# Patient Record
Sex: Male | Born: 2000 | Race: White | Hispanic: No | Marital: Single | Smoking: Never smoker
Health system: Southern US, Community
[De-identification: ages and names within clinical notes are randomized; demographics above are authoritative.]

## PROBLEM LIST (undated history)

## (undated) DIAGNOSIS — J302 Other seasonal allergic rhinitis: Secondary | ICD-10-CM

## (undated) HISTORY — DX: Other seasonal allergic rhinitis: J30.2

## (undated) HISTORY — PX: OTHER SURGICAL HISTORY: SHX169

---
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
## 2020-05-28 ENCOUNTER — Emergency Department (HOSPITAL_COMMUNITY): Payer: 59

## 2020-05-28 ENCOUNTER — Other Ambulatory Visit: Payer: Self-pay

## 2020-05-28 ENCOUNTER — Emergency Department (HOSPITAL_COMMUNITY)
Admission: EM | Admit: 2020-05-28 | Discharge: 2020-05-28 | Disposition: A | Discharge status: Home / Self Care | Payer: 59 | Attending: Emergency Medicine | Admitting: Emergency Medicine

## 2020-05-28 ENCOUNTER — Encounter (HOSPITAL_COMMUNITY): Payer: Self-pay

## 2020-05-28 DIAGNOSIS — R002 Palpitations: Secondary | ICD-10-CM | POA: Diagnosis not present

## 2020-05-28 DIAGNOSIS — R739 Hyperglycemia, unspecified: Secondary | ICD-10-CM | POA: Insufficient documentation

## 2020-05-28 DIAGNOSIS — R0602 Shortness of breath: Secondary | ICD-10-CM | POA: Diagnosis not present

## 2020-05-28 DIAGNOSIS — F17220 Nicotine dependence, chewing tobacco, uncomplicated: Secondary | ICD-10-CM | POA: Diagnosis not present

## 2020-05-28 DIAGNOSIS — R42 Dizziness and giddiness: Secondary | ICD-10-CM | POA: Insufficient documentation

## 2020-05-28 DIAGNOSIS — F419 Anxiety disorder, unspecified: Secondary | ICD-10-CM | POA: Diagnosis not present

## 2020-05-28 DIAGNOSIS — R079 Chest pain, unspecified: Secondary | ICD-10-CM

## 2020-05-28 DIAGNOSIS — R0789 Other chest pain: Secondary | ICD-10-CM | POA: Insufficient documentation

## 2020-05-28 LAB — BASIC METABOLIC PANEL
Anion gap: 8 (ref 5–15)
BUN: 12 mg/dL (ref 6–20)
CO2: 25 mmol/L (ref 22–32)
Calcium: 9.5 mg/dL (ref 8.9–10.3)
Chloride: 104 mmol/L (ref 98–111)
Creatinine, Ser: 1.17 mg/dL (ref 0.61–1.24)
GFR, Estimated: >60 mL/min (ref >60)
Glucose, Bld: 105 mg/dL — ABNORMAL HIGH (ref 70–99)
Potassium: 4.4 mmol/L (ref 3.5–5.1)
Sodium: 137 mmol/L (ref 135–145)

## 2020-05-28 LAB — CBC
HCT: 52 % (ref 39.0–52.0)
Hemoglobin: 17.6 g/dL — ABNORMAL HIGH (ref 13.0–17.0)
MCH: 28.3 pg (ref 26.0–34.0)
MCHC: 33.8 g/dL (ref 30.0–36.0)
MCV: 83.7 fL (ref 80.0–100.0)
Platelets: 325 10*3/uL (ref 150–400)
RBC: 6.21 MIL/uL — ABNORMAL HIGH (ref 4.22–5.81)
RDW: 12.3 % (ref 11.5–15.5)
WBC: 6.2 10*3/uL (ref 4.0–10.5)
nRBC: 0 % (ref 0.0–0.2)

## 2020-05-28 LAB — TROPONIN I (HIGH SENSITIVITY)
Troponin I (High Sensitivity): <2 ng/L (ref <18)
Troponin I (High Sensitivity): <2 ng/L (ref <18)

## 2020-05-28 MED ORDER — HYDROXYZINE HCL 25 MG PO TABS: 25.0000 mg | ORAL_TABLET | Freq: Four times a day (QID) | ORAL | 0 refills | Status: AC

## 2020-05-28 MED ORDER — HYDROXYZINE HCL 25 MG PO TABS
50.0000 mg | ORAL_TABLET | Freq: Once | ORAL | Status: AC
  Administered 2020-05-28: 50 mg via ORAL
  Filled 2020-05-28: qty 2

## 2020-05-28 NOTE — ED Triage Notes (Signed)
Pt sent here from [facility] for possible new onset a-fib. Pt reports anything different in his life is about 2 months ago he started using THC vape  Pens.Pt reports the sob, dizziness, cp started x2 days ago.

## 2020-05-28 NOTE — Discharge Instructions (Addendum)
As discussed all of your labs were reassuring today.  Your cardiac markers were normal.  Your EKG did not show any abnormal rhythms.  I have included the number of the cardiologist.  Please call tomorrow to schedule an appointment for further evaluation of your palpitations.  I am sending you home with some anxiety medication.  Take as needed for anxiety.  Please follow-up with PCP within the next week for further evaluation.  Return to the ER for worsening symptoms.

## 2020-05-28 NOTE — ED Provider Notes (Signed)
[facility] EMERGENCY DEPARTMENT Provider Note   CSN: [id number] Arrival date & time: 05/28/20  1407     History Chief Complaint  Patient presents with  . Chest Pain    Adrian Moore is a 20 y.o. male with no significant past medical history who presents to the ED due to intermittent shortness of breath, dizziness, palpitations, lightheadedness x3 to 4 days.  Patient describes palpitations as his heart beating faster than normal associated with substernal, non-radiating chest "discomfort".  Patient describes chest pain as a pressure-like sensation. He notes when palpitations begin he needs to "control his breathing".  Denies history of blood clots, recent surgeries, recent long immobilizations, hormonal treatments, lower extremity edema.  No family history of early CAD. No relation to exertion or change in position.  No recent illness. He vapes occasionally.  Patient was evaluated in urgent care prior to arrival and sent to the ED due to possible A. fib with RVR. No cardiac history. He does not anxiety could be contributing to some of his symptoms. No treatment prior to arrival.   History obtained from patient and past medical records. No interpreter used during encounter.    HPI: A 20 year old patient presents for evaluation of chest pain. Initial onset of pain was less than one hour ago. The patient's chest pain is described as heaviness/pressure/tightness and is not worse with exertion. The patient's chest pain is middle- or left-sided, is not well-localized, is not sharp and does not radiate to the arms/jaw/neck. The patient does not complain of nausea and denies diaphoresis. The patient has no history of stroke, has no history of peripheral artery disease, has not smoked in the past 90 days, denies any history of treated diabetes, has no relevant family history of coronary artery disease (first degree relative at less than age 11), is not hypertensive, has no history of  hypercholesterolemia and does not have an elevated BMI (>=30).   History reviewed. No pertinent past medical history.  There are no problems to display for this patient.   History reviewed. No pertinent surgical history.     History reviewed. No pertinent family history.  Social History   Tobacco Use  . Smoking status: Never Smoker  . Smokeless tobacco: Current User    Home Medications Prior to Admission medications   Medication Sig Start Date End Date Taking? Authorizing Provider  hydrOXYzine (ATARAX/VISTARIL) 25 MG tablet Take 1 tablet (25 mg total) by mouth every 6 (six) hours. 05/28/20  Yes Mannie Stabile, PA-C    Allergies    Patient has no known allergies.  Review of Systems   Review of Systems  Constitutional: Negative for chills and fever.  Respiratory: Positive for shortness of breath.   Cardiovascular: Positive for chest pain and palpitations. Negative for leg swelling.  Gastrointestinal: Negative for abdominal pain, diarrhea, nausea and vomiting.  Neurological: Positive for dizziness and light-headedness.  All other systems reviewed and are negative.   Physical Exam Updated Vital Signs BP 131/82   Pulse 70   Temp 98.3 F (36.8 C) (Oral)   Resp (!) 21   Ht 5\' 11"  (1.803 m)   Wt 77.1 kg   SpO2 96%   BMI 23.71 kg/m   Physical Exam Vitals and nursing note reviewed.  Constitutional:      General: He is not in acute distress.    Appearance: He is not ill-appearing.  HENT:     Head: Normocephalic.  Eyes:     Pupils: Pupils are  equal, round, and reactive to light.  Cardiovascular:     Rate and Rhythm: Normal rate and regular rhythm.     Pulses: Normal pulses.     Heart sounds: Normal heart sounds. No murmur heard. No friction rub. No gallop.   Pulmonary:     Effort: Pulmonary effort is normal.     Breath sounds: Normal breath sounds.  Abdominal:     General: Abdomen is flat. There is no distension.     Palpations: Abdomen is soft.      Tenderness: There is no abdominal tenderness. There is no guarding or rebound.  Musculoskeletal:        General: Normal range of motion.     Cervical back: Neck supple.     Comments: No lower extremity edema. Negative homan sign bilaterally.  Skin:    General: Skin is warm and dry.  Neurological:     General: No focal deficit present.     Mental Status: He is alert.  Psychiatric:        Mood and Affect: Mood normal.        Behavior: Behavior normal.     ED Results / Procedures / Treatments   Labs (all labs ordered are listed, but only abnormal results are displayed) Labs Reviewed  BASIC METABOLIC PANEL - Abnormal; Notable for the following components:      Result Value   Glucose, Bld 105 (*)    All other components within normal limits  CBC - Abnormal; Notable for the following components:   RBC 6.21 (*)    Hemoglobin 17.6 (*)    All other components within normal limits  TROPONIN I (HIGH SENSITIVITY)  TROPONIN I (HIGH SENSITIVITY)    EKG EKG Interpretation  Date/Time:  Tuesday May 28 2020 14:19:44 EDT Ventricular Rate:  88 PR Interval:  116 QRS Duration: 126 QT Interval:  366 QTC Calculation: 442 R Axis:   121 Text Interpretation: Normal sinus rhythm with sinus arrhythmia Right bundle branch block Abnormal ECG No old tracing to compare Confirmed by Susy Frizzle [phone]) on 05/28/2020 2:58:08 PM   Radiology DG Chest 2 View  Result Date: 05/28/2020 CLINICAL DATA:  Shortness of breath with chest tightness EXAM: CHEST - 2 VIEW COMPARISON:  None. FINDINGS: Lungs are clear. Heart size and pulmonary vascularity are normal. No adenopathy. No pneumothorax. No bone lesions. IMPRESSION: Lungs clear.  Cardiac silhouette normal. Electronically Signed   By: Bretta Bang III M.D.   On: 05/28/2020 16:26    Procedures Procedures   Medications Ordered in ED Medications  hydrOXYzine (ATARAX/VISTARIL) tablet 50 mg (has no administration in time range)    ED Course  I  have reviewed the triage vital signs and the nursing notes.  Pertinent labs & imaging results that were available during my care of the patient were reviewed by me and considered in my medical decision making (see chart for details).    MDM Rules/Calculators/A&P HEAR Score: 2                       20 year old male presents to the ED from urgent care due to palpitations associated with shortness of breath, dizziness, lightheadedness, and chest pressure.  No cardiac history.  Patient's sent from urgent care due to possible A. fib with RVR however, I have reviewed the EKG from urgent care which demonstrates normal sinus rhythm with no signs of acute ischemia. No A. Fib.  Upon arrival, patient afebrile, not tachycardic or hypoxic.  Patient in no acute distress and nontoxic-appearing.  Physical exam reassuring.  No signs of DVT.  Low suspicion for PE/DVT.  Cardiac lab order to rule out cardiac etiology. Suspect a possible anxiety etiology.   CBC reassuring with no leukocytosis. BMP with mild hyperglycemia at 105. No anion gap. Normal renal function. No major electrolyte derangements.  Chest x-ray personally reviewed which is negative for signs of pneumonia, pneumothorax, or widened mediastinum. EKG reviewed which demonstrates NSR with sinus arrhythmia and RBBB. No acute ischemia. Delta troponin flat. Low suspicion for ACS. Patient has had normal HR during his entire ED stay. PERC negative and low risk using wells criteria. Doubt PE/DVT. Patient notes improvement in symptoms after Vistaril. Cardiology number given to patient at discharge. Advised patient to call tomorrow to schedule an appointment for further evaluation of palpitations. Patient discharged with vistaril as needed for anxiety. Strict ED precautions discussed with patient. Patient states understanding and agrees to plan. Patient discharged home in no acute distress and stable vitals.  Final Clinical Impression(s) / ED Diagnoses Final diagnoses:   Nonspecific chest pain  Anxiety  Palpitations    Rx / DC Orders ED Discharge Orders         Ordered    hydrOXYzine (ATARAX/VISTARIL) 25 MG tablet  Every 6 hours        05/28/20 1842           Jesusita Oka 05/28/20 1907    Pollyann Savoy, MD 05/29/20 [phone]

## 2020-06-19 ENCOUNTER — Other Ambulatory Visit: Payer: Self-pay

## 2020-06-19 ENCOUNTER — Ambulatory Visit (INDEPENDENT_AMBULATORY_CARE_PROVIDER_SITE_OTHER): Payer: 59

## 2020-06-19 ENCOUNTER — Encounter: Payer: Self-pay | Admitting: Cardiology

## 2020-06-19 ENCOUNTER — Ambulatory Visit (INDEPENDENT_AMBULATORY_CARE_PROVIDER_SITE_OTHER): Payer: 59 | Admitting: Cardiology

## 2020-06-19 VITALS — BP 126/80 | HR 94 | Ht 72.0 in | Wt 168.0 lb

## 2020-06-19 DIAGNOSIS — R002 Palpitations: Secondary | ICD-10-CM | POA: Insufficient documentation

## 2020-06-19 DIAGNOSIS — Z7289 Other problems related to lifestyle: Secondary | ICD-10-CM

## 2020-06-19 DIAGNOSIS — I4510 Unspecified right bundle-branch block: Secondary | ICD-10-CM

## 2020-06-19 HISTORY — DX: Unspecified right bundle-branch block: I45.10

## 2020-06-19 HISTORY — DX: Other problems related to lifestyle: Z72.89

## 2020-06-19 HISTORY — DX: Palpitations: R00.2

## 2020-06-19 NOTE — Progress Notes (Signed)
Cardiology Consultation:    Date:  06/19/2020   ID:  COAL NEARHOOD, DOB [birth year], MRN [id number]  PCP:  Montez Hageman, DO  Cardiologist:  Gypsy Balsam, MD   Referring MD: No ref. provider found   Chief Complaint  Patient presents with  . Heart fluttering    History of Present Illness:    Adrian Moore is a 20 y.o. male who is being seen today for the evaluation of fluttering in the chest at the request of No ref. provider found.  He isa healthy 20 years old gentleman who likes to exercise recently however he did notice some fluttering when he mean by that he will feel his heart speeding up last for few minutes there is no chest pain tightness squeezing pressure burning associated with this sensation there is no shortness of breath but sometimes he will feel dizzy he described this as kind of unsteady but not to the point of passing out.  Almost things make him work.  He exercised on the regular basis but he use always before exercising some energy boost.  He denies having any chest pain tightness squeezing pressure burning chest.  He thinks that his ability to exercise is a little less than before because of those fluttering in the chest palpitations.  He does not do aerobic exercise he does do weightlifting. Does not have family history of premature coronary disease. He is not on any special diet. He vapes but does not smoke cigarettes  Past Medical History:  Diagnosis Date  . Seasonal allergies     Past Surgical History:  Procedure Laterality Date  . ACL repaired      Current Medications: Current Meds  Medication Sig  . hydrOXYzine (ATARAX/VISTARIL) 25 MG tablet Take 1 tablet (25 mg total) by mouth every 6 (six) hours. (Patient taking differently: Take 25 mg by mouth as needed (Palpitations).)     Allergies:   Patient has no known allergies.   Social History   Socioeconomic History  . Marital status: Single    Spouse name: Not on file  . Number of children:  Not on file  . Years of education: Not on file  . Highest education level: Not on file  Occupational History  . Not on file  Tobacco Use  . Smoking status: Never Smoker  . Smokeless tobacco: Current User  Substance and Sexual Activity  . Alcohol use: Yes    Alcohol/week: 2.0 standard drinks    Types: 1 Cans of beer, 1 Shots of liquor per week    Comment: Ocassional   . Drug use: Never  . Sexual activity: Not Currently  Other Topics Concern  . Not on file  Social History Narrative  . Not on file   Social Determinants of Health   Financial Resource Strain: Not on file  Food Insecurity: Not on file  Transportation Needs: Not on file  Physical Activity: Not on file  Stress: Not on file  Social Connections: Not on file     Family History: The patient's family history includes Healthy in his father and mother; Heart disease in his maternal grandfather, maternal grandmother, and paternal grandmother. ROS:   Please see the history of present illness.    All 14 point review of systems negative except as described per history of present illness.  EKGs/Labs/Other Studies Reviewed:    The following studies were reviewed today: I did review record from the emergency room there is no TSH checked Chem-7 was normal  CBC showed a little bit elevation of red blood cells.  EKG:  EKG is  ordered today.  The ekg ordered today demonstrates normal sinus rhythm normal P interval right bundle branch block  Recent Labs: 05/28/2020: BUN 12; Creatinine, Ser 1.17; Hemoglobin 17.6; Platelets 325; Potassium 4.4; Sodium 137  Recent Lipid Panel No results found for: CHOL, TRIG, HDL, CHOLHDL, VLDL, LDLCALC, LDLDIRECT  Physical Exam:    VS:  BP 126/80 (BP Location: Right Arm, Patient Position: Sitting)   Pulse 94   Ht 6' (1.829 m)   Wt 168 lb (76.2 kg)   SpO2 95%   BMI 22.78 kg/m     Wt Readings from Last 3 Encounters:  06/19/20 168 lb (76.2 kg)  05/28/20 170 lb (77.1 kg)     GEN:  Well  nourished, well developed in no acute distress HEENT: Normal NECK: No JVD; No carotid bruits LYMPHATICS: No lymphadenopathy CARDIAC: RRR, no murmurs, no rubs, no gallops RESPIRATORY:  Clear to auscultation without rales, wheezing or rhonchi  ABDOMEN: Soft, non-tender, non-distended MUSCULOSKELETAL:  No edema; No deformity  SKIN: Warm and dry NEUROLOGIC:  Alert and oriented x 3 PSYCHIATRIC:  Normal affect   ASSESSMENT:    1. Palpitations   2. Right bundle branch block   3. Engages in vaping    PLAN:    In order of problems listed above:  1. Palpitations.  I will ask him to wear Zio patch to see exactly what kind of arrhythmia with dealing with.  As a part of evaluation we will schedule him to have an echocardiogram he does use caffeine product before exercising I told him not to do that.  He is also vaping and I told him to stop doing this as well.  His mother was present during our conversation. 2. Right bundle branch block which is incidental finding unclear significance at this moment.  Again echocardiogram will be done.  Will make me worry is the fact he does have a little elevation of red blood cells.  Again echocardiogram will be done with looking for any pathology suppression of the right side also for potential shunts.  However he is S2 is still single.  And normally stated. 3. Vaping again he was told to quit doing this. 4. We will check his cholesterol next time when he will be in our office.   Medication Adjustments/Labs and Tests Ordered: Current medicines are reviewed at length with the patient today.  Concerns regarding medicines are outlined above.  Orders Placed This Encounter  Procedures  . TSH  . Lipid panel  . LONG TERM MONITOR (3-14 DAYS)  . EKG 12-Lead  . ECHOCARDIOGRAM COMPLETE   No orders of the defined types were placed in this encounter.   Signed, Georgeanna Lea, MD, [facility]. 06/19/2020 4:27 PM    [facility]

## 2020-06-19 NOTE — Patient Instructions (Signed)
Medication Instructions:  Your physician recommends that you continue on your current medications as directed. Please refer to the Current Medication list given to you today.  *If you need a refill on your cardiac medications before your next appointment, please call your pharmacy*   Lab Work: Your physician recommends that you return for lab work when fasting: lipid, tsh  If you have labs (blood work) drawn today and your tests are completely normal, you will receive your results only by: Marland Kitchen MyChart Message (if you have MyChart) OR . A paper copy in the mail If you have any lab test that is abnormal or we need to change your treatment, we will call you to review the results.   Testing/Procedures: Your physician has requested that you have an echocardiogram. Echocardiography is a painless test that uses sound waves to create images of your heart. It provides your doctor with information about the size and shape of your heart and how well your heart's chambers and valves are working. This procedure takes approximately one hour. There are no restrictions for this procedure.  A zio monitor was ordered today. It will remain on for 7 days. You will then return monitor and event diary in provided box. It takes 1-2 weeks for report to be downloaded and returned to [place]. We will call you with the results. If monitor falls off or has orange flashing light, please call Zio for further instructions.      Follow-Up: At [facility], you and your health needs are our priority.  As part of our continuing mission to provide you with exceptional heart care, we have created designated Provider Care Teams.  These Care Teams include your primary Cardiologist (physician) and Advanced Practice Providers (APPs -  Physician Assistants and Nurse Practitioners) who all work together to provide you with the care you need, when you need it.  We recommend signing up for the patient portal called "MyChart".  Sign up  information is provided on this After Visit Summary.  MyChart is used to connect with patients for Virtual Visits (Telemedicine).  Patients are able to view lab/test results, encounter notes, upcoming appointments, etc.  Non-urgent messages can be sent to your provider as well.   To learn more about what you can do with MyChart, go to [email or web address].    Your next appointment:   2 month(s)  The format for your next appointment:   In Person  Provider:   Gypsy Balsam, MD   Other Instructions   Echocardiogram An echocardiogram is a test that uses sound waves (ultrasound) to produce images of the heart. Images from an echocardiogram can provide important information about:  Heart size and shape.  The size and thickness and movement of your heart's walls.  Heart muscle function and strength.  Heart valve function or if you have stenosis. Stenosis is when the heart valves are too narrow.  If blood is flowing backward through the heart valves (regurgitation).  A tumor or infectious growth around the heart valves.  Areas of heart muscle that are not working well because of poor blood flow or injury from a heart attack.  Aneurysm detection. An aneurysm is a weak or damaged part of an artery wall. The wall bulges out from the normal force of blood pumping through the body. Tell a health care provider about:  Any allergies you have.  All medicines you are taking, including vitamins, herbs, eye drops, creams, and over-the-counter medicines.  Any blood disorders you have.  Any  surgeries you have had.  Any medical conditions you have.  Whether you are pregnant or may be pregnant. What are the risks? Generally, this is a safe test. However, problems may occur, including an allergic reaction to dye (contrast) that may be used during the test. What happens before the test? No specific preparation is needed. You may eat and drink normally. What happens during the  test?  You will take off your clothes from the waist up and put on a hospital gown.  Electrodes or electrocardiogram (ECG)patches may be placed on your chest. The electrodes or patches are then connected to a device that monitors your heart rate and rhythm.  You will lie down on a table for an ultrasound exam. A gel will be applied to your chest to help sound waves pass through your skin.  A handheld device, called a transducer, will be pressed against your chest and moved over your heart. The transducer produces sound waves that travel to your heart and bounce back (or "echo" back) to the transducer. These sound waves will be captured in real-time and changed into images of your heart that can be viewed on a video monitor. The images will be recorded on a computer and reviewed by your health care provider.  You may be asked to change positions or hold your breath for a short time. This makes it easier to get different views or better views of your heart.  In some cases, you may receive contrast through an IV in one of your veins. This can improve the quality of the pictures from your heart. The procedure may vary among health care providers and hospitals.   What can I expect after the test? You may return to your normal, everyday life, including diet, activities, and medicines, unless your health care provider tells you not to do that. Follow these instructions at home:  It is up to you to get the results of your test. Ask your health care provider, or the department that is doing the test, when your results will be ready.  Keep all follow-up visits. This is important. Summary  An echocardiogram is a test that uses sound waves (ultrasound) to produce images of the heart.  Images from an echocardiogram can provide important information about the size and shape of your heart, heart muscle function, heart valve function, and other possible heart problems.  You do not need to do anything to  prepare before this test. You may eat and drink normally.  After the echocardiogram is completed, you may return to your normal, everyday life, unless your health care provider tells you not to do that. This information is not intended to replace advice given to you by your health care provider. Make sure you discuss any questions you have with your health care provider. Document Revised: 09/26/2019 Document Reviewed: 09/26/2019 Elsevier Patient Education  2021 [employer].

## 2020-07-05 ENCOUNTER — Telehealth: Payer: Self-pay | Admitting: Cardiology

## 2020-07-05 NOTE — Telephone Encounter (Signed)
New message ° ° ° ° ° °Returning a call to the nurse to get monitor results °

## 2020-07-05 NOTE — Telephone Encounter (Signed)
Left message for patient to return call.

## 2020-07-08 NOTE — Telephone Encounter (Signed)
Left message for patient to return call.

## 2020-07-10 NOTE — Telephone Encounter (Signed)
Patient informed of results.  

## 2020-07-16 ENCOUNTER — Other Ambulatory Visit: Payer: 59

## 2020-08-07 ENCOUNTER — Other Ambulatory Visit: Payer: Self-pay

## 2020-08-07 ENCOUNTER — Ambulatory Visit (INDEPENDENT_AMBULATORY_CARE_PROVIDER_SITE_OTHER): Payer: 59

## 2020-08-07 DIAGNOSIS — I4510 Unspecified right bundle-branch block: Secondary | ICD-10-CM | POA: Diagnosis not present

## 2020-08-07 DIAGNOSIS — R002 Palpitations: Secondary | ICD-10-CM | POA: Diagnosis not present

## 2020-08-07 LAB — ECHOCARDIOGRAM COMPLETE
Area-P 1/2: 4.41 cm2
S' Lateral: 3 cm

## 2020-08-07 NOTE — Progress Notes (Signed)
Complete echocardiogram performed.  Jimmy Saskia Simerson RDCS, RVT  

## 2020-08-12 ENCOUNTER — Telehealth: Payer: Self-pay | Admitting: Cardiology

## 2020-08-12 NOTE — Telephone Encounter (Signed)
Patient returning call for echo results. 

## 2020-08-12 NOTE — Telephone Encounter (Signed)
Called patient informed him of results.  

## 2020-09-03 ENCOUNTER — Other Ambulatory Visit: Payer: Self-pay

## 2020-09-03 DIAGNOSIS — J302 Other seasonal allergic rhinitis: Secondary | ICD-10-CM | POA: Insufficient documentation

## 2020-09-04 ENCOUNTER — Encounter: Payer: Self-pay | Admitting: Cardiology

## 2020-09-04 ENCOUNTER — Ambulatory Visit (INDEPENDENT_AMBULATORY_CARE_PROVIDER_SITE_OTHER): Payer: 59 | Admitting: Cardiology

## 2020-09-04 ENCOUNTER — Other Ambulatory Visit: Payer: Self-pay

## 2020-09-04 VITALS — BP 112/78 | HR 83 | Ht 72.0 in | Wt 179.1 lb

## 2020-09-04 DIAGNOSIS — R002 Palpitations: Secondary | ICD-10-CM | POA: Diagnosis not present

## 2020-09-04 DIAGNOSIS — I4510 Unspecified right bundle-branch block: Secondary | ICD-10-CM

## 2020-09-04 DIAGNOSIS — J302 Other seasonal allergic rhinitis: Secondary | ICD-10-CM | POA: Diagnosis not present

## 2020-09-04 NOTE — Progress Notes (Signed)
Cardiology Office Note:    Date:  09/04/2020   ID:  Adrian Moore, DOB 08-13-2000, MRN [id number]  PCP:  Montez Hageman, DO  Cardiologist:  Gypsy Balsam, MD    Referring MD: Montez Hageman, DO   Chief Complaint  Patient presents with   Follow-up  I am doing well  History of Present Illness:    Adrian Moore is a 20 y.o. male who was referred to [place] because of some palpitations.  Monitor was put on and we find no significant arrhythmia.  He also got some history of anxiety.  He comes today to follow-up overall he is doing well.  Denies have any chest pain tightness squeezing pressure burning chest.  We did talk about need to exercise on the regular basis and good diet.  Past Medical History:  Diagnosis Date   Engages in vaping 06/19/2020   Palpitations 06/19/2020   Right bundle branch block 06/19/2020   Seasonal allergies     Past Surgical History:  Procedure Laterality Date   ACL repaired      Current Medications: Current Meds  Medication Sig   hydrOXYzine (ATARAX/VISTARIL) 25 MG tablet Take 1 tablet (25 mg total) by mouth every 6 (six) hours. (Patient taking differently: Take 25 mg by mouth as needed for anxiety (Palpitations).)     Allergies:   Patient has no known allergies.   Social History   Socioeconomic History   Marital status: Single    Spouse name: Not on file   Number of children: Not on file   Years of education: Not on file   Highest education level: Not on file  Occupational History   Not on file  Tobacco Use   Smoking status: Never   Smokeless tobacco: Current  Substance and Sexual Activity   Alcohol use: Yes    Alcohol/week: 2.0 standard drinks    Types: 1 Cans of beer, 1 Shots of liquor per week    Comment: Ocassional    Drug use: Never   Sexual activity: Not Currently  Other Topics Concern   Not on file  Social History Narrative   Not on file   Social Determinants of Health   Financial Resource Strain: Not on file  Food  Insecurity: Not on file  Transportation Needs: Not on file  Physical Activity: Not on file  Stress: Not on file  Social Connections: Not on file     Family History: The patient's family history includes Healthy in his father and mother; Heart disease in his maternal grandfather, maternal grandmother, and paternal grandmother. ROS:   Please see the history of present illness.    All 14 point review of systems negative except as described per history of present illness  EKGs/Labs/Other Studies Reviewed:      Recent Labs: 05/28/2020: BUN 12; Creatinine, Ser 1.17; Hemoglobin 17.6; Platelets 325; Potassium 4.4; Sodium 137  Recent Lipid Panel No results found for: CHOL, TRIG, HDL, CHOLHDL, VLDL, LDLCALC, LDLDIRECT  Physical Exam:    VS:  BP 112/78 (BP Location: Right Arm, Patient Position: Sitting)   Pulse 83   Ht 6' (1.829 m)   Wt 179 lb 1.3 oz (81.2 kg)   SpO2 98%   BMI 24.29 kg/m     Wt Readings from Last 3 Encounters:  09/04/20 179 lb 1.3 oz (81.2 kg)  06/19/20 168 lb (76.2 kg)  05/28/20 170 lb (77.1 kg)     GEN:  Well nourished, well developed in no acute distress HEENT:  Normal NECK: No JVD; No carotid bruits LYMPHATICS: No lymphadenopathy CARDIAC: RRR, no murmurs, no rubs, no gallops RESPIRATORY:  Clear to auscultation without rales, wheezing or rhonchi  ABDOMEN: Soft, non-tender, non-distended MUSCULOSKELETAL:  No edema; No deformity  SKIN: Warm and dry LOWER EXTREMITIES: no swelling NEUROLOGIC:  Alert and oriented x 3 PSYCHIATRIC:  Normal affect   ASSESSMENT:    1. Palpitations   2. Right bundle branch block   3. Seasonal allergies    PLAN:    In order of problems listed above:  Palpitations seems to be under control monitor did not show any significant arrhythmia, echocardiogram showed normal structural heart. Right bundle branch block.  Noted, echocardiogram normal Seasonal allergies have been treated by internal medicine team  Overall he is doing  fine from cardiac point of view I see him back 1 more time in a year and then on as-needed basis.  I asked him to let me know if he develop palpitations or syncope   Medication Adjustments/Labs and Tests Ordered: Current medicines are reviewed at length with the patient today.  Concerns regarding medicines are outlined above.  No orders of the defined types were placed in this encounter.  Medication changes: No orders of the defined types were placed in this encounter.   Signed, Georgeanna Lea, MD, [facility] 09/04/2020 12:49 PM    [facility]

## 2020-09-04 NOTE — Patient Instructions (Signed)
Medication Instructions:  .isntcu  *If you need a refill on your cardiac medications before your next appointment, please call your pharmacy*   Lab Work: Your physician recommends that you return for lab work in:  TODAY: Lipids, TSH  If you have labs (blood work) drawn today and your tests are completely normal, you will receive your results only by: MyChart Message (if you have MyChart) OR A paper copy in the mail If you have any lab test that is abnormal or we need to change your treatment, we will call you to review the results.   Testing/Procedures: None   Follow-Up: At [facility], you and your health needs are our priority.  As part of our continuing mission to provide you with exceptional heart care, we have created designated Provider Care Teams.  These Care Teams include your primary Cardiologist (physician) and Advanced Practice Providers (APPs -  Physician Assistants and Nurse Practitioners) who all work together to provide you with the care you need, when you need it.  We recommend signing up for the patient portal called "MyChart".  Sign up information is provided on this After Visit Summary.  MyChart is used to connect with patients for Virtual Visits (Telemedicine).  Patients are able to view lab/test results, encounter notes, upcoming appointments, etc.  Non-urgent messages can be sent to your provider as well.   To learn more about what you can do with MyChart, go to [email or web address].    Your next appointment:   1 year(s)  The format for your next appointment:   In Person  Provider:   Gypsy Balsam, MD   Other Instructions

## 2023-03-02 IMAGING — CR DG CHEST 2V
2 series · 2 of 2 positions shown · non-contrast
Comparison: None.

CLINICAL DATA: Shortness of breath with chest tightness

EXAM:
CHEST - 2 VIEW

[chest lat]
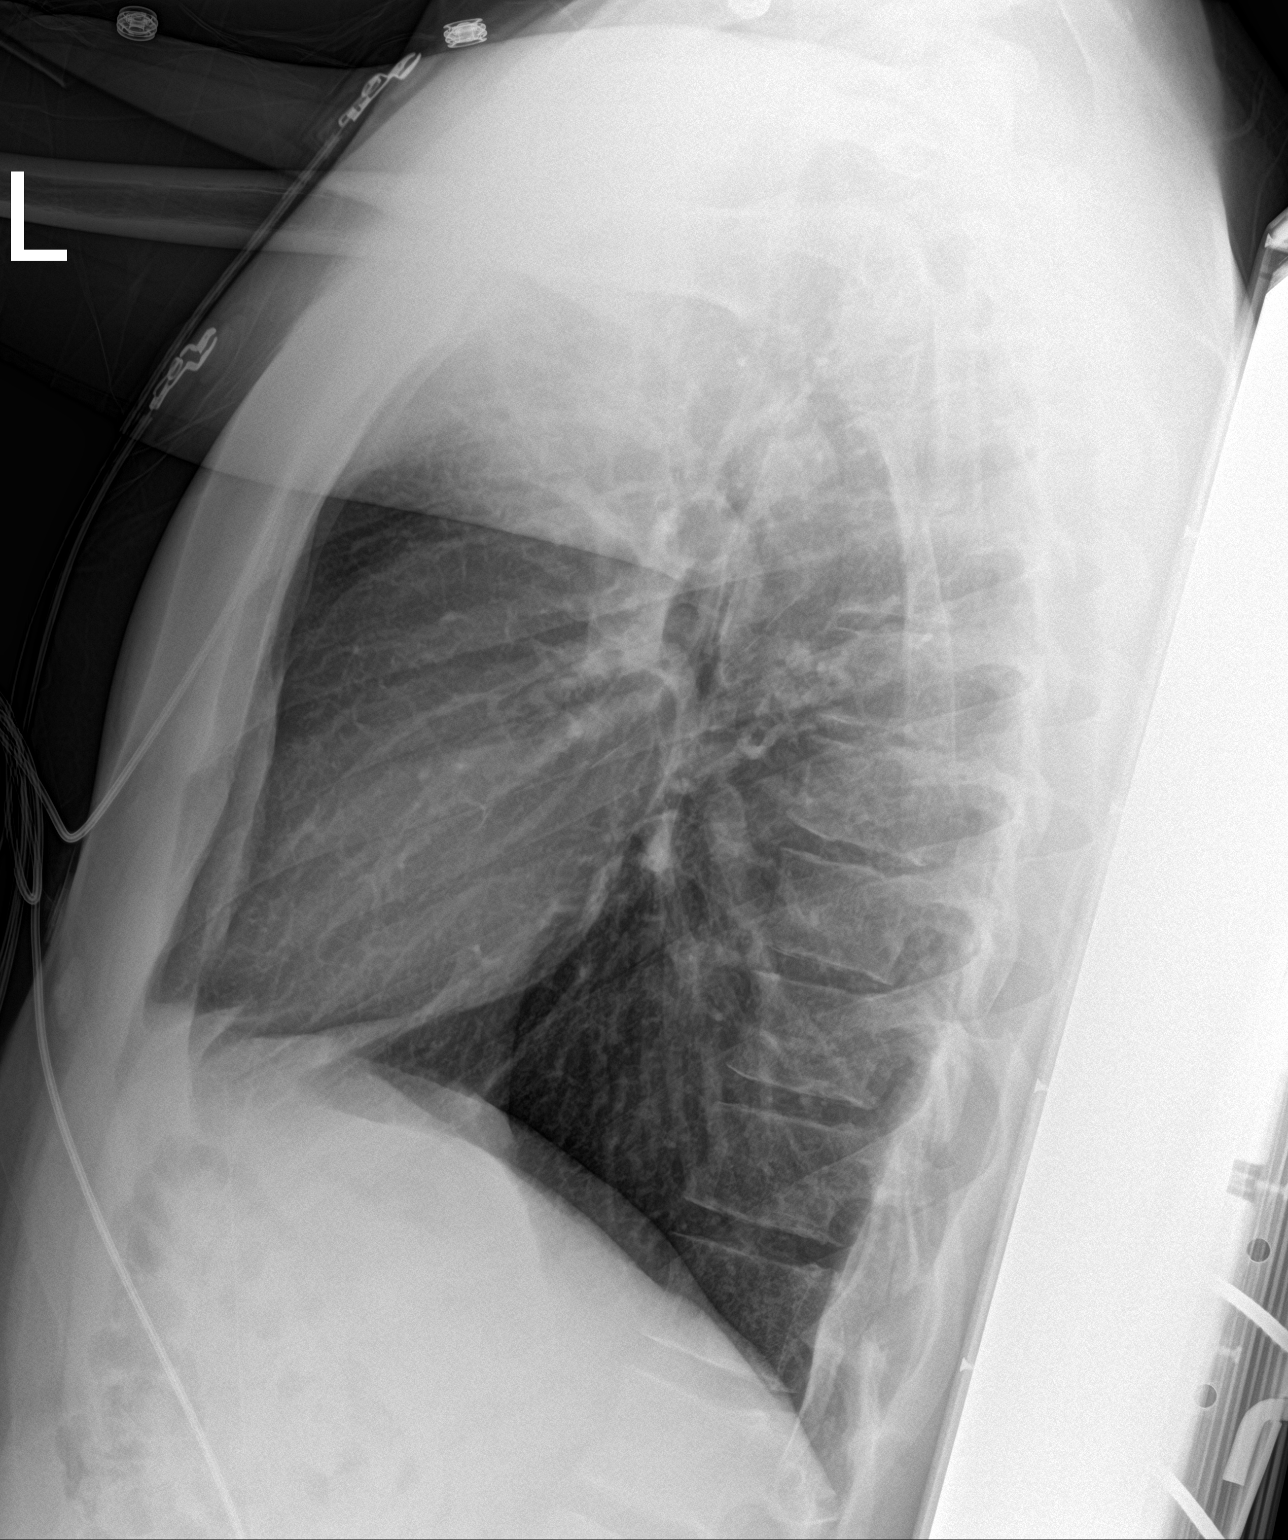

[chest ap]
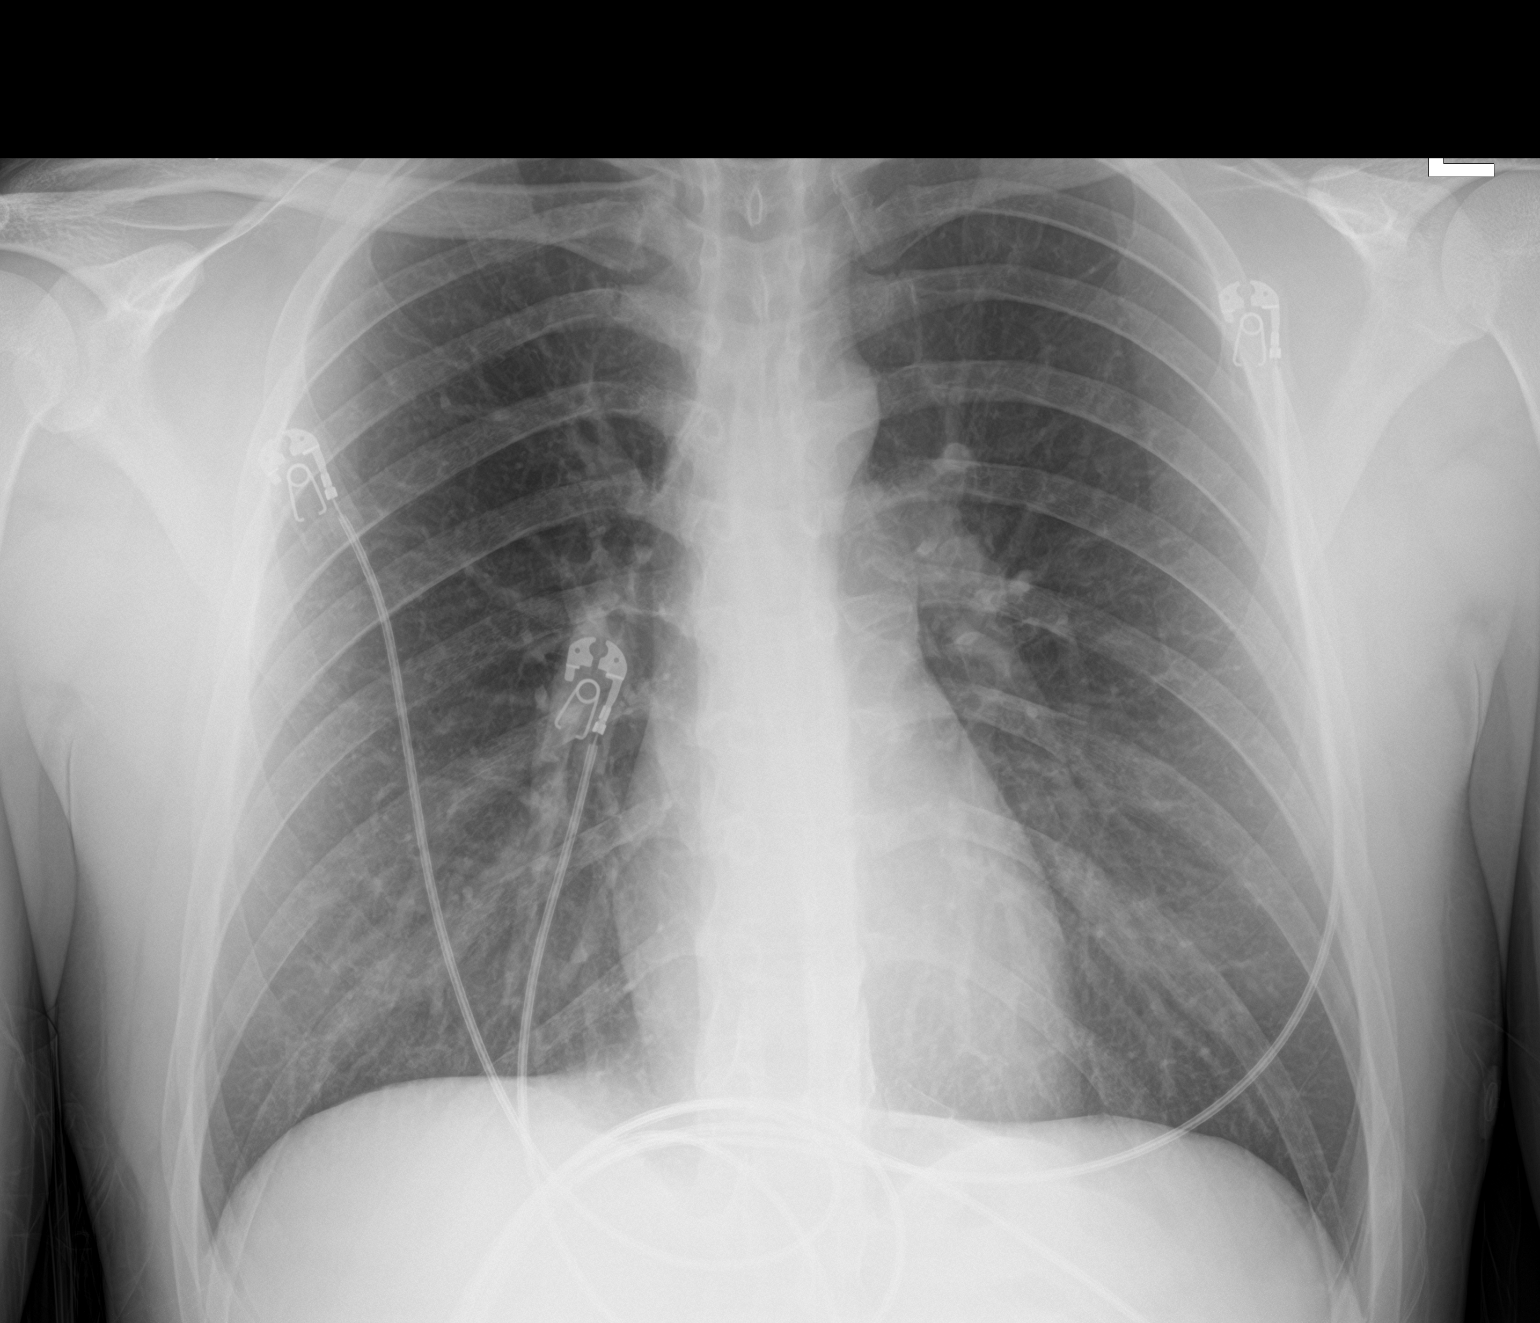

[2 of 2 positions shown; findings below may reference images not displayed]

FINDINGS: Lungs are clear. Heart size and pulmonary vascularity are normal. No
adenopathy. No pneumothorax. No bone lesions.
IMPRESSION: Lungs clear.  Cardiac silhouette normal.
